# Patient Record
Sex: Male | Born: 1980 | Hispanic: No | Marital: Married | Smoking: Never smoker
Health system: Southern US, Community
[De-identification: ages and names within clinical notes are randomized; demographics above are authoritative.]

---

## 2008-11-27 ENCOUNTER — Ambulatory Visit (HOSPITAL_BASED_OUTPATIENT_CLINIC_OR_DEPARTMENT_OTHER): Admission: RE | Admit: 2008-11-27 | Discharge: 2008-11-27 | Payer: Self-pay | Admitting: Specialist

## 2010-09-20 ENCOUNTER — Ambulatory Visit
Admission: RE | Admit: 2010-09-20 | Discharge: 2010-09-20 | Payer: Self-pay | Source: Home / Self Care | Attending: Specialist | Admitting: Specialist

## 2010-09-20 LAB — POCT I-STAT 4, (NA,K, GLUC, HGB,HCT)
Glucose, Bld: 98 mg/dL (ref 70–99)
HCT: 48 % (ref 39.0–52.0)
Hemoglobin: 16.3 g/dL (ref 13.0–17.0)
Potassium: 3.6 mEq/L (ref 3.5–5.1)
Sodium: 141 mEq/L (ref 135–145)

## 2010-12-26 LAB — BASIC METABOLIC PANEL
BUN: 13 mg/dL (ref 6–23)
CO2: 29 mEq/L (ref 19–32)
Calcium: 9.6 mg/dL (ref 8.4–10.5)
Chloride: 101 mEq/L (ref 96–112)
Creatinine, Ser: 1.01 mg/dL (ref 0.4–1.5)
GFR calc Af Amer: >60 mL/min (ref >60)
GFR calc non Af Amer: >60 mL/min (ref >60)
Glucose, Bld: 79 mg/dL (ref 70–99)
Potassium: 4.4 mEq/L (ref 3.5–5.1)
Sodium: 137 mEq/L (ref 135–145)

## 2010-12-26 LAB — POCT HEMOGLOBIN-HEMACUE: Hemoglobin: 15.9 g/dL (ref 13.0–17.0)

## 2011-01-28 NOTE — Op Note (Signed)
Carlos Molina, Carlos Molina              ACCOUNT NO.:  [id number]   MEDICAL RECORD NO.:  [id number]          PATIENT TYPE:  AMB   LOCATION:  [facility]                         FACILITY:  [facility]   PHYSICIAN:  Jene Every, M.D.    DATE OF BIRTH:  Nov 29, 1980   DATE OF PROCEDURE:  11/27/2008  DATE OF DISCHARGE:                               OPERATIVE REPORT   PREOPERATIVE DIAGNOSIS:  Pathologic plica of the left knee.   POSTOPERATIVE DIAGNOSIS:  Pathologic plica of the left knee.   PROCEDURE PERFORMED:  1. Left knee arthroscopy.  2. Excision of pathologic plica.  3. Evaluation under anesthesia.   BRIEF HISTORY:  This is a 30 year old male injured at work who had  persistent patellofemoral pain of the  knee with an MRI indicating  essentially unremarkable.  He had injections in the suprapatellar  region.  He had snapping and popping consistent with a pathologic plica.  He had normal tracking, normal [employer].  No excessive of tilt.   He was indicated for arthroscopic evaluation, excision if plica noted.  The risks and benefits were discussed with him including bleeding,  infection, no change in symptoms, worsening symptoms, need for repeat  debridement, etc.   TECHNIQUE:  With the patient in supine position, after induction of  adequate anesthesia and 2 grams of Kefzol, the left lower extremity was  prepped and draped in the usual sterile fashion.  A lateral parapatellar  portal and superomedial parapatellar portal was fashioned with a #11  blade.  Ingress cannula atraumatically placed.  Irrigant was utilized to  insufflate the joint.  The direct visualization the medial parapatellar  portal was fashioned with a #11 blade after localization with an 18  gauge needle, sparing the medial meniscus.   Preoperatively, we had marked the area over the lateral aspect of the  patellofemoral joint that was painful and had popping associated with  that.  Examination of the medial compartment revealed  normal femoral  condyle, tibial plateau and meniscus stable to probe palpation without  evidence of tear.   Examination of intercondylar notch revealed normal ACL and PCL.   Examination of the lateral compartment revealed normal femoral condyle,  normal tibial plateau and meniscus stable to probe palpation without  evidence of tear.   Examination of te patellofemoral joint revealed normal patellofemoral  tracking.  There was no chondromalacia noted.  In area over the lateral  aspect of the patellofemoral joint there was adhesions or plica noted.  I introduced a 18 gauge needle from the area marked preoperatively in  the extra-articular area and the region of symptoms and this localized  the area right at the point of this pathologic plica.  Prior to  insertion of the instrumentation with flexion and extension there was  some palpable popping noted associated with this.   Next, I introduced a shaver and utilized to morselize this plica which  extended from the superior lateral aspect of the patella to the inferior  lateral aspect of the femur.  Given this was the only tissue within that  region I felt it was reasonable to excise that  and it  probably is the  pathologic area.  After the excision with flexion and extension I was  unable to palpate that area of crepitus.  Again, normal patellofemoral  tracking.  I was able to tilt the patella normally.  I was not able to  translate it laterally.   I felt the pathology had been addressed.  The knee was copiously  lavaged.  All instrumentation was removed.  The portals were closed with  4-0 nylon simple sutures.  Marcaine 0.25% with epinephrine was  infiltrated in the joint.  The wound was dressed sterilely.  Awoken  without difficulty and transported to the recovery room in satisfactory  condition.  The patient tolerated the procedure and there were no  complications.  No assistant.      Jene Every, M.D.  Electronically  Signed     JB/MEDQ  D:  11/27/2008  T:  11/27/2008  Job:  [id number]

## 2015-01-07 ENCOUNTER — Encounter: Payer: Self-pay | Admitting: Emergency Medicine

## 2015-01-07 ENCOUNTER — Emergency Department (INDEPENDENT_AMBULATORY_CARE_PROVIDER_SITE_OTHER): Payer: Worker's Compensation

## 2015-01-07 ENCOUNTER — Emergency Department (INDEPENDENT_AMBULATORY_CARE_PROVIDER_SITE_OTHER)
Admission: EM | Admit: 2015-01-07 | Discharge: 2015-01-07 | Disposition: A | Discharge status: Home / Self Care | Payer: Worker's Compensation | Source: Home / Self Care | Attending: Family Medicine | Admitting: Family Medicine

## 2015-01-07 DIAGNOSIS — M79631 Pain in right forearm: Secondary | ICD-10-CM | POA: Diagnosis not present

## 2015-01-07 DIAGNOSIS — M25531 Pain in right wrist: Secondary | ICD-10-CM | POA: Diagnosis not present

## 2015-01-07 DIAGNOSIS — S63501A Unspecified sprain of right wrist, initial encounter: Secondary | ICD-10-CM

## 2015-01-07 DIAGNOSIS — M25439 Effusion, unspecified wrist: Secondary | ICD-10-CM

## 2015-01-07 DIAGNOSIS — M79641 Pain in right hand: Secondary | ICD-10-CM | POA: Diagnosis not present

## 2015-01-07 DIAGNOSIS — M25539 Pain in unspecified wrist: Secondary | ICD-10-CM | POA: Diagnosis not present

## 2015-01-07 MED ORDER — MELOXICAM 15 MG PO TABS: 15.0000 mg | ORAL_TABLET | Freq: Every day | ORAL | Status: AC

## 2015-01-07 NOTE — ED Notes (Signed)
Patient presents to [facility] with C/O pain in the right hand, wrist, and forearm, after falling yesterday while at work. Edema and slight bruising noted.  to the base of the right thumb. Spoke with Loistine ChanceAmanda Koffman the Workers compensation person for [employer] and she advised no drug screen needed. No deformity noted. Patient rates pain a 2/10.

## 2015-01-07 NOTE — ED Provider Notes (Signed)
CSN: [id number]     Arrival date & time 01/07/15  1120 History   First MD Initiated Contact with Patient 01/07/15 1237     Chief Complaint  Patient presents with  . 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  He complains of persistent pain primarily in the right thumb area.  Patient is a 34 y.o. male presenting with wrist pain. The history is provided by the patient.  Wrist Pain This is a new problem. The current episode started yesterday. The problem occurs constantly. The problem has not changed since onset.Associated symptoms comments: none. Exacerbated by: movement of right thumb and wrist. Nothing relieves the symptoms. He has tried nothing for the symptoms.    History reviewed. No pertinent past medical history. History reviewed. No pertinent past surgical history. History reviewed. No pertinent family history. History  Substance Use Topics  . Smoking status: Never Smoker   . Smokeless tobacco: Not on file  . Alcohol Use: No    Review of Systems  All other systems reviewed and are negative.   Allergies  Review of patient's allergies indicates not on file.  Home Medications   Prior to Admission medications   Medication Sig Start Date End Date Taking? Authorizing Provider  meloxicam (MOBIC) 15 MG tablet Take 1 tablet (15 mg total) by mouth daily. Take with food each evening. 01/07/15   Lattie Haw, MD   BP 156/100 mmHg  Pulse 115  Temp(Src) 98.3 F (36.8 C) (Oral)  Ht  (1.803 m)  Wt 235 lb (106.595 kg)  BMI 32.79 kg/m2  SpO2 98% Physical Exam  Constitutional: He is oriented to person, place, and time. He appears well-developed and well-nourished. No distress.  HENT:  Head: Atraumatic.  Eyes: Pupils are equal, round, and reactive to light.  Musculoskeletal:       Right hand: He exhibits tenderness and bony tenderness. He exhibits normal range  of motion, normal two-point discrimination, normal capillary refill, no deformity, no laceration and no swelling. Normal sensation noted. Decreased strength noted.       Hands: There is distinct tenderness over the right anatomic snuffbox.  Right thumb has full range of motion.    Right wrist has good range of motion with minimal tendeness.  Neurological: He is alert and oriented to person, place, and time.  Skin: Skin is warm and dry.  Nursing note and vitals reviewed.   ED Course  Procedures  none  Imaging Review Dg Forearm Right  01/07/2015   CLINICAL DATA:  Right forearm pain after falling yesterday.  EXAM: RIGHT FOREARM - 2 VIEW  COMPARISON:  None.  FINDINGS: There is no evidence of fracture or other focal bone lesions. Soft tissues are unremarkable.  IMPRESSION: Normal examination.   Electronically Signed   By: Beckie Salts M.D.   On: 01/07/2015 13:56   Dg Wrist Complete Right  01/07/2015   CLINICAL DATA:  Right wrist pain after falling yesterday.  EXAM: RIGHT WRIST - COMPLETE 3+ VIEW  COMPARISON:  None.  FINDINGS: There is no evidence of fracture or dislocation. There is no evidence of arthropathy or other focal bone abnormality. Soft tissues are unremarkable.  IMPRESSION: Normal examination.   Electronically Signed   By: Beckie Salts M.D.   On: 01/07/2015 13:56   Dg Hand Complete Right  01/07/2015   CLINICAL DATA:  Right  hand pain after falling yesterday afternoon  EXAM: RIGHT HAND - COMPLETE 3+ VIEW  COMPARISON:  None.  FINDINGS: There is no evidence of fracture or dislocation. There is no evidence of arthropathy or other focal bone abnormality. Soft tissues are unremarkable.  IMPRESSION: Normal examination.   Electronically Signed   By: Beckie SaltsSteven  Reid M.D.   On: 01/07/2015 13:56     MDM   1. Sprain of right wrist, initial encounter   2. Pain and swelling of wrist    Note past history of fractured right scaphoid last year.  Patient has significant tenderness over the anatomic  snuffbox today. Thumb spica splint applied. Rx for Mobic 15mg  daily. Wear wrist brace until [facility].  Apply ice pack for 20 to 30 minutes, 3 to 4 times daily  Continue until pain decreases.  May resume normal duties at work but avoid lifting with right hand/arm. Followup with [facility] on 01/11/15.  If patient continues to have persistent tenderness over the scaphoid, consider re-imaging.    Lattie HawStephen A Beese, MD 01/07/15 2155

## 2015-01-07 NOTE — Discharge Instructions (Signed)
Wear wrist brace until [facility]-up [facility].  Apply ice pack for 20 to 30 minutes, 3 to 4 times daily  Continue until pain decreases.  May resume normal duties at work but avoid heavy lifting with right hand/arm.

## 2015-01-07 NOTE — ED Notes (Signed)
Waiting, NAD noted at this time.

## 2015-01-11 ENCOUNTER — Ambulatory Visit (INDEPENDENT_AMBULATORY_CARE_PROVIDER_SITE_OTHER): Payer: Self-pay

## 2015-01-11 ENCOUNTER — Other Ambulatory Visit: Payer: Self-pay | Admitting: Adult Health

## 2015-01-11 DIAGNOSIS — M25531 Pain in right wrist: Secondary | ICD-10-CM

## 2015-01-11 DIAGNOSIS — T1490XA Injury, unspecified, initial encounter: Secondary | ICD-10-CM

## 2015-01-11 DIAGNOSIS — R52 Pain, unspecified: Secondary | ICD-10-CM

## 2015-01-11 DIAGNOSIS — M79641 Pain in right hand: Secondary | ICD-10-CM

## 2015-01-15 ENCOUNTER — Other Ambulatory Visit: Payer: Self-pay | Admitting: Adult Health

## 2015-01-15 ENCOUNTER — Ambulatory Visit (INDEPENDENT_AMBULATORY_CARE_PROVIDER_SITE_OTHER): Payer: Self-pay

## 2015-01-15 DIAGNOSIS — M25531 Pain in right wrist: Secondary | ICD-10-CM

## 2015-01-15 DIAGNOSIS — R52 Pain, unspecified: Secondary | ICD-10-CM

## 2015-01-15 DIAGNOSIS — M79644 Pain in right finger(s): Secondary | ICD-10-CM

## 2016-02-25 IMAGING — CR DG HAND COMPLETE 3+V*R*
3 series · 3 of 3 positions shown · non-contrast
Comparison: None.

CLINICAL DATA: Fall.  Worsening pain.  Initial evaluation .

EXAM:
RIGHT HAND - COMPLETE 3+ VIEW

[hand pa]
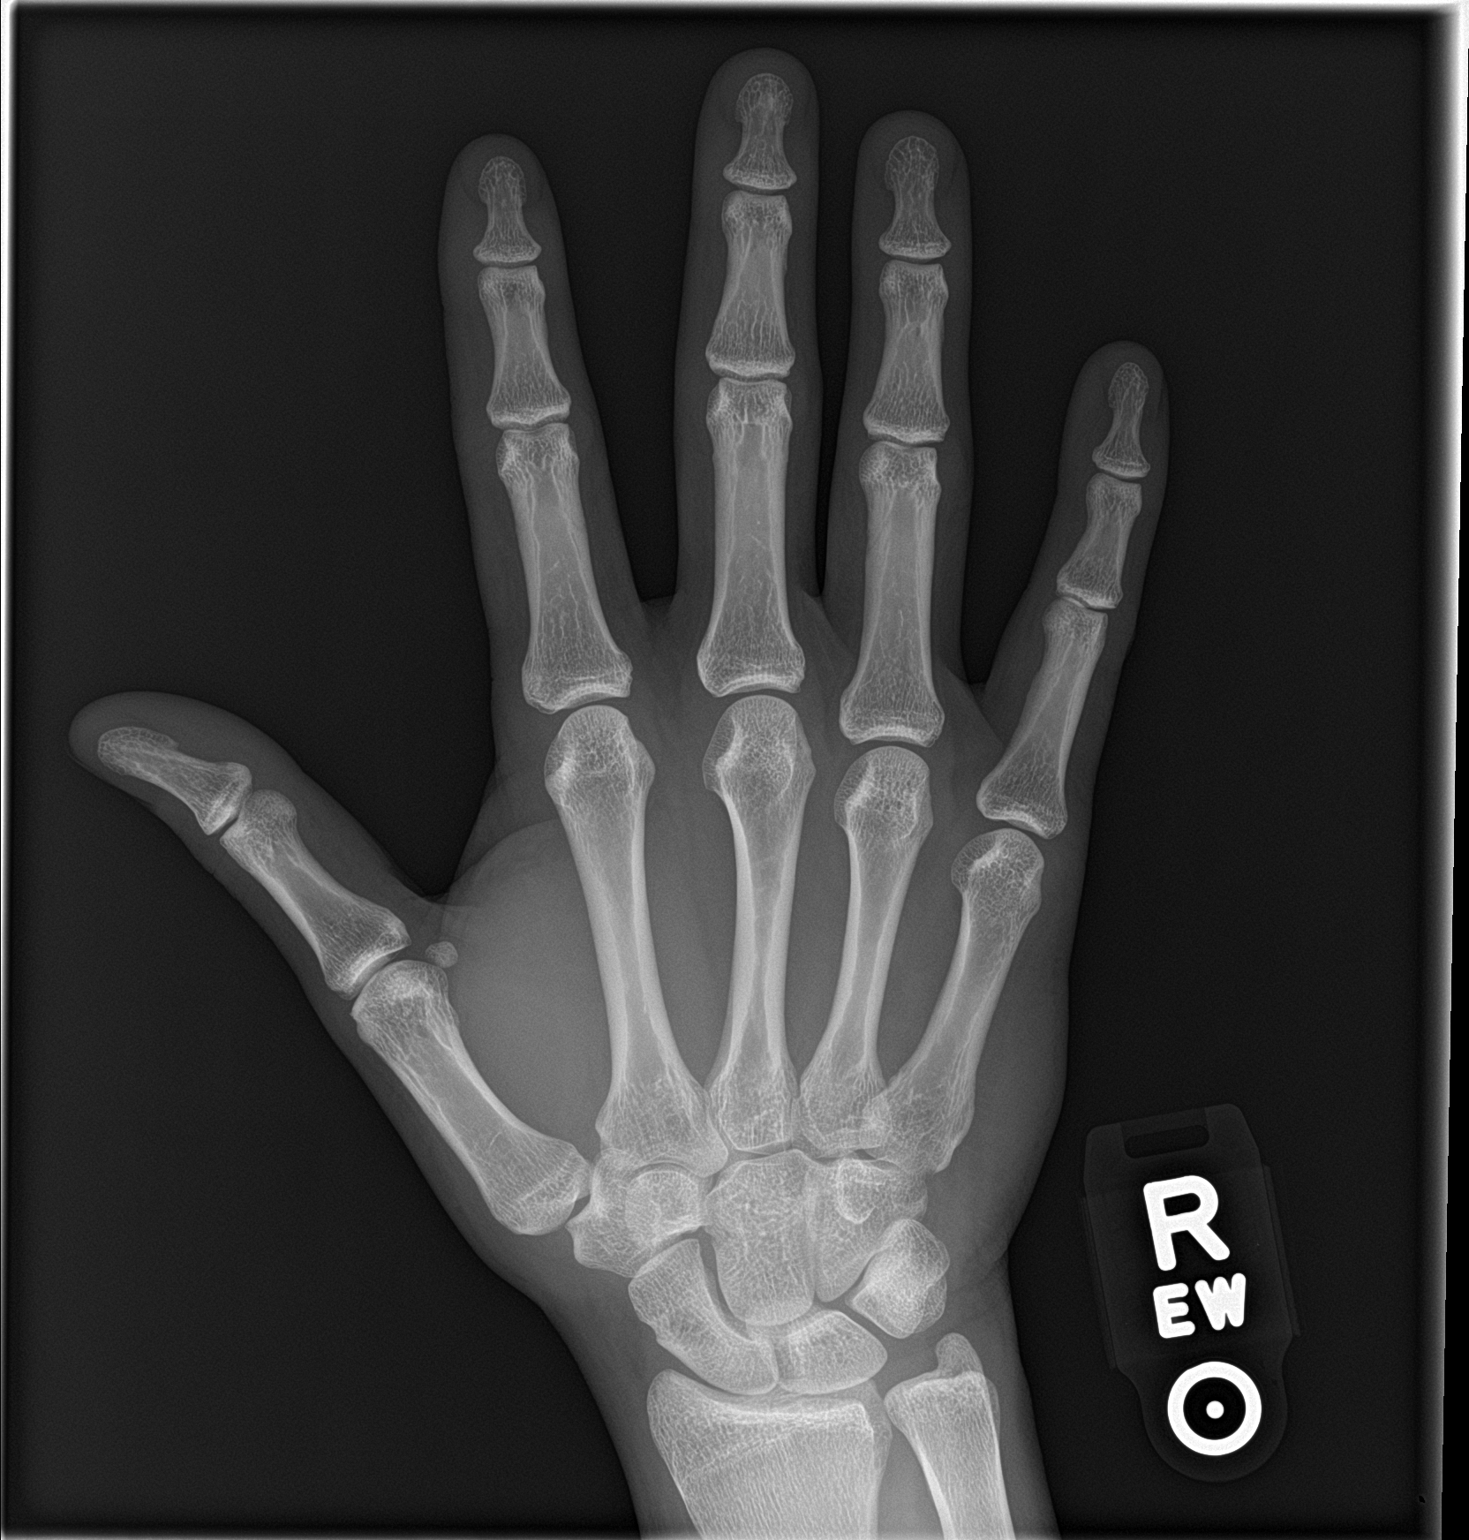

[hand obl]
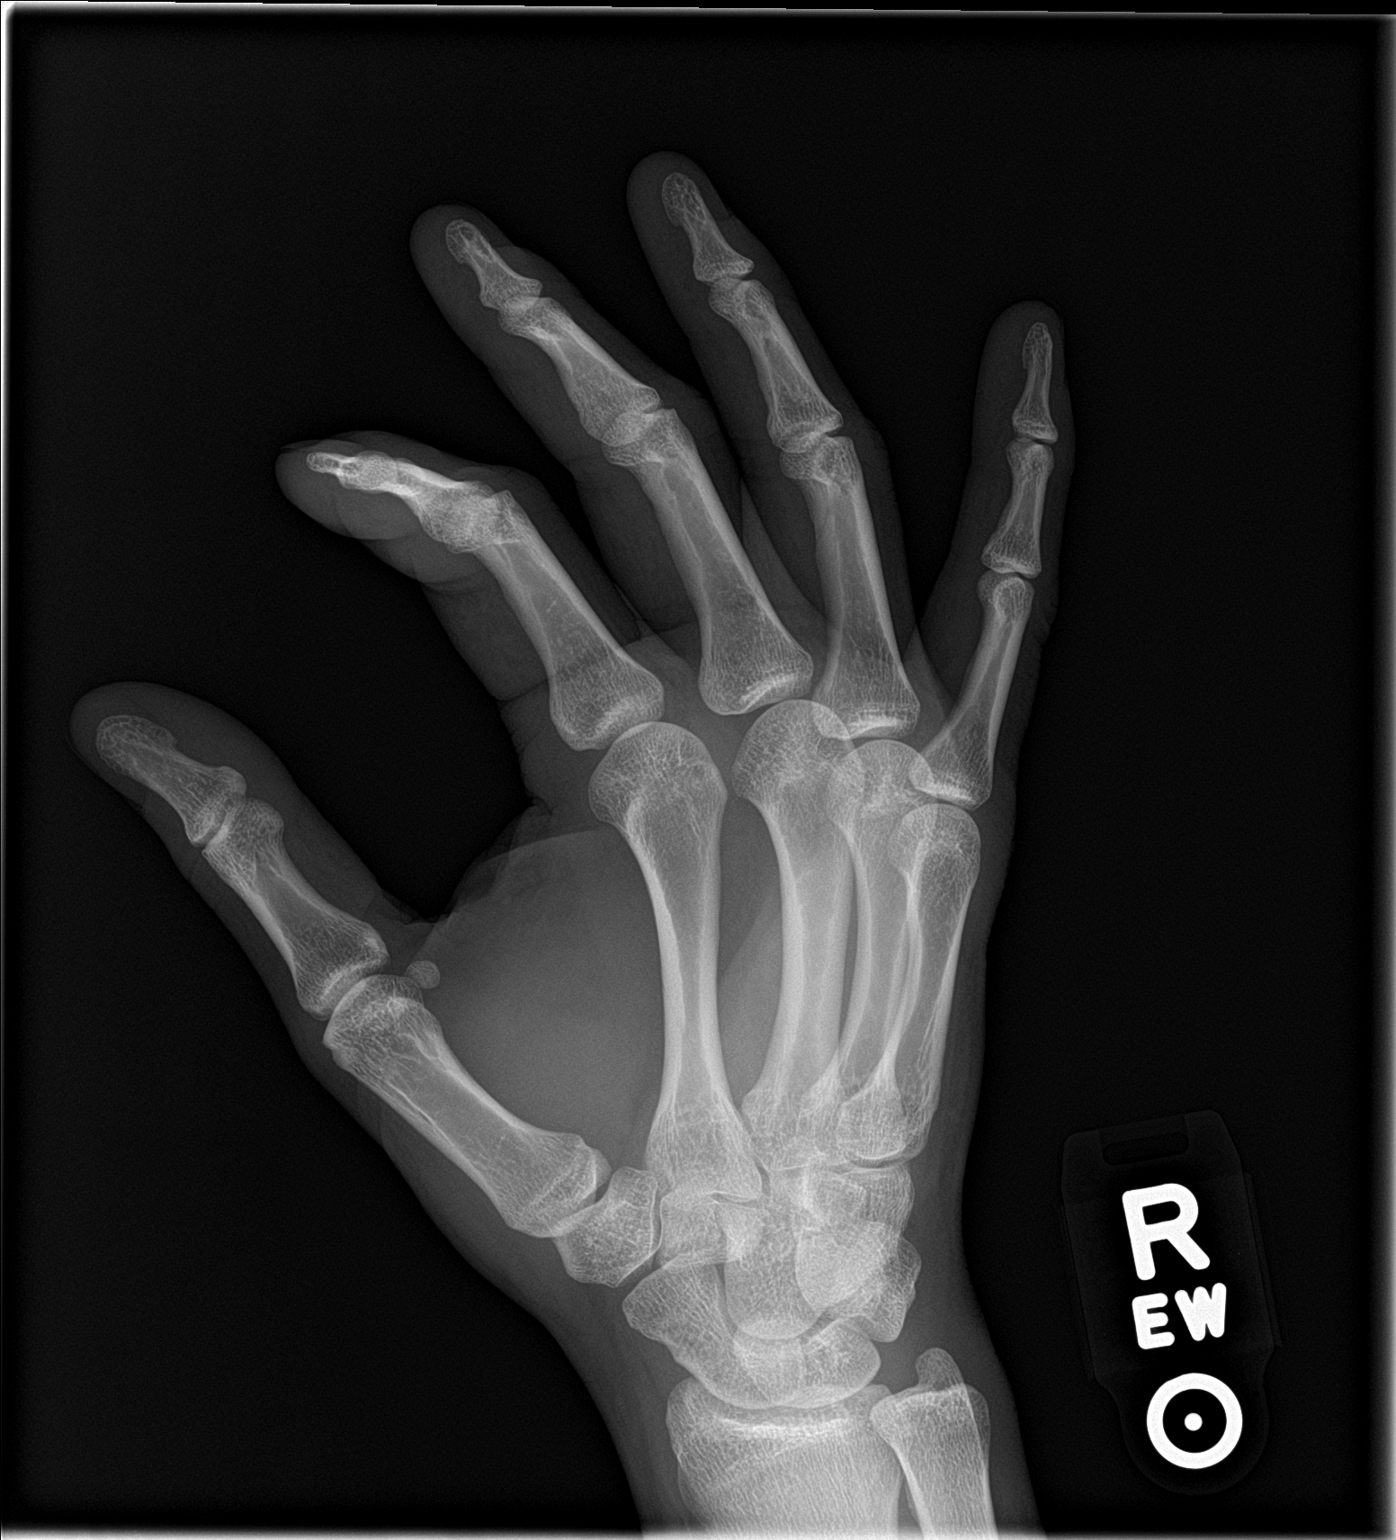

[hand lat]
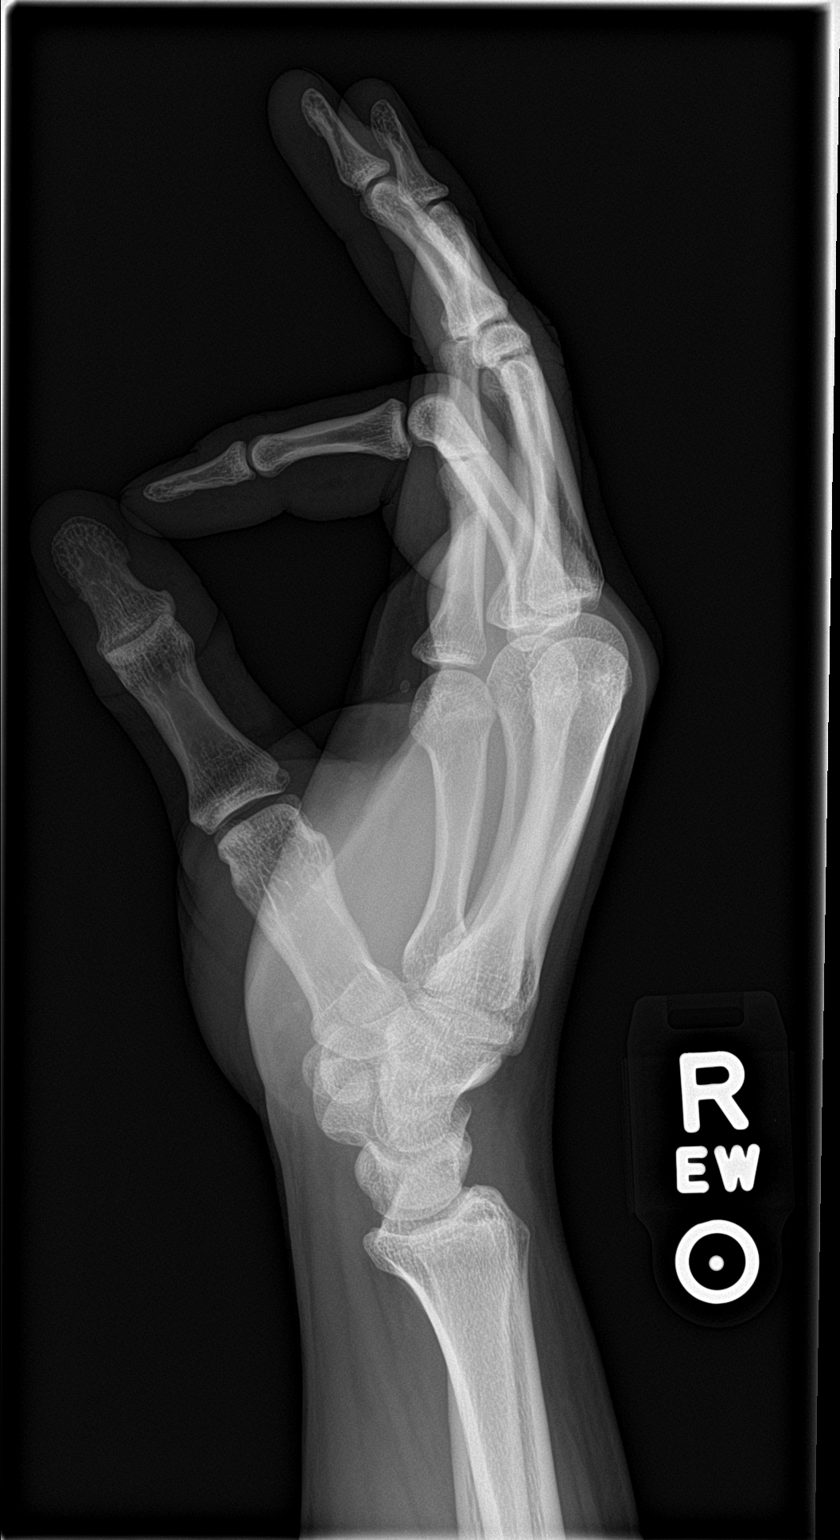

[3 of 3 positions shown; findings below may reference images not displayed]

FINDINGS: There is no evidence of fracture or dislocation. There is no
evidence of arthropathy or other focal bone abnormality. Soft
tissues are unremarkable.
IMPRESSION: No acute abnormality.
